# Patient Record
Sex: Male | Born: 1995 | Race: White | Hispanic: No | Marital: Single | Smoking: Never smoker
Health system: Southern US, Community
[De-identification: ages and names within clinical notes are randomized; demographics above are authoritative.]

## PROBLEM LIST (undated history)

## (undated) DIAGNOSIS — M545 Low back pain, unspecified: Secondary | ICD-10-CM

## (undated) DIAGNOSIS — K5190 Ulcerative colitis, unspecified, without complications: Secondary | ICD-10-CM

## (undated) DIAGNOSIS — R002 Palpitations: Secondary | ICD-10-CM

## (undated) DIAGNOSIS — K921 Melena: Secondary | ICD-10-CM

## (undated) DIAGNOSIS — R519 Headache, unspecified: Secondary | ICD-10-CM

## (undated) HISTORY — DX: Melena: K92.1

## (undated) HISTORY — DX: Headache, unspecified: R51.9

## (undated) HISTORY — DX: Palpitations: R00.2

## (undated) HISTORY — DX: Low back pain, unspecified: M54.50

## (undated) HISTORY — DX: Ulcerative colitis, unspecified, without complications: K51.90

---
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
## 2015-01-18 DIAGNOSIS — R002 Palpitations: Secondary | ICD-10-CM | POA: Insufficient documentation

## 2019-06-01 ENCOUNTER — Ambulatory Visit: Payer: Managed Care, Other (non HMO) | Attending: Internal Medicine

## 2019-06-01 DIAGNOSIS — Z20822 Contact with and (suspected) exposure to covid-19: Secondary | ICD-10-CM

## 2019-06-02 LAB — NOVEL CORONAVIRUS, NAA: SARS-CoV-2, NAA: NOT DETECTED

## 2020-04-25 DIAGNOSIS — R519 Headache, unspecified: Secondary | ICD-10-CM | POA: Insufficient documentation

## 2020-09-26 NOTE — Progress Notes (Signed)
[name] [address] [place] [address] Phone: [phone] Subjective:   Bruce Donath, am serving as a scribe for Dr. Antoine Primas. This visit occurred during the SARS-CoV-2 public health emergency.  Safety protocols were in place, including screening questions prior to the visit, additional usage of staff PPE, and extensive cleaning of exam room while observing appropriate contact time as indicated for disinfecting solutions.   I'm seeing this patient by the request  of:  Pcp, No  CC: Mid back pain   [facility]:[id number]  Joe Esparza is a 25 y.o. male coming in with complaint of back pain. Patient states that his pain is below R scapula for past 2 months. Pain can get sharp but is mostly dull. Has not tried anything for pain. 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  Patient does not remember any true injury.      Social History   Socioeconomic History  . Marital status: Married    Spouse name: Not on file  . Number of children: Not on file  . Years of education: Not on file  . Highest education level: Not on file  Occupational History  . Not on file  Tobacco Use  . Smoking status: Not on file  . Smokeless tobacco: Not on file  Substance and Sexual Activity  . Alcohol use: Not on file  . Drug use: Not on file  . Sexual activity: Not on file  Other Topics Concern  . Not on file  Social History Narrative  . Not on file   Social Determinants of Health   Financial Resource Strain: Not on file  Food Insecurity: Not on file  Transportation Needs: Not on file  Physical Activity: Not on file  Stress: Not on file  Social Connections: Not on file   Not on File No family history on file. No current outpatient medications on file.   Reviewed prior external information including notes and imaging  from  primary care provider As well as notes that were available from care everywhere and other healthcare systems.  Past medical history, social, surgical and family history all reviewed in electronic medical record.  No pertanent information unless stated regarding to the chief complaint.   Review of Systems:  No headache, visual changes, nausea, vomiting, diarrhea, constipation, dizziness, abdominal pain, skin rash, fevers, chills, night sweats, weight loss, swollen lymph nodes, body aches, joint swelling, chest pain, shortness of breath, mood changes. POSITIVE muscle aches only intermittently on the right side  Objective  Blood pressure 128/80, pulse 93, height 5\' 11"  (1.803 m), weight 165 lb (74.8 kg), SpO2 97 %.   General: No apparent distress alert and oriented x3 mood and affect normal, dressed appropriately.  HEENT: Pupils equal, extraocular movements intact  Respiratory: Patient's speak in full sentences and does not appear short of breath  Cardiovascular: No lower extremity edema, non tender, no erythema  Gait normal with good balance and coordination.  MSK: Right shoulder shows the patient does have some mild scapular dyskinesis noted.  Seems to be on the inferior aspect of the scapula.  Does have a bit of friction type rubbing felt in this area with abduction of the arm.  Good strength noted.  Neck exam very minimal tightness.  Negative Spurling's.  Osteopathic findings C4 F RS right  T4 extended  rotated and side bent right T7 extended rotated and side bent right with inhaled rib L3 flexed rotated and side bent right  [id number]; 15 additional minutes spent for Therapeutic exercises as stated in above notes.  This included exercises focusing on stretching, strengthening, with significant focus on eccentric aspects.   Long term goals include an improvement in range of motion, strength, endurance as well as avoiding reinjury. Patient's frequency would include in 1-2 times a day, 3-5  times a week for a duration of 6-12 weeks.  Exercises that included:  Basic scapular stabilization to include adduction and depression of scapula Scaption, focusing on proper movement and good control Internal and External rotation utilizing a theraband, with elbow tucked at side entire time Rows with theraband  Proper technique shown and discussed handout in great detail with ATC.  All questions were discussed and answered.      Impression and Recommendations:     The above documentation has been reviewed and is accurate and complete Judi Saa, DO

## 2020-09-27 ENCOUNTER — Other Ambulatory Visit: Payer: Self-pay

## 2020-09-27 ENCOUNTER — Encounter: Payer: Self-pay | Admitting: Family Medicine

## 2020-09-27 ENCOUNTER — Other Ambulatory Visit: Payer: Self-pay | Admitting: Family Medicine

## 2020-09-27 ENCOUNTER — Ambulatory Visit (INDEPENDENT_AMBULATORY_CARE_PROVIDER_SITE_OTHER): Payer: Managed Care, Other (non HMO)

## 2020-09-27 ENCOUNTER — Ambulatory Visit (INDEPENDENT_AMBULATORY_CARE_PROVIDER_SITE_OTHER): Payer: Managed Care, Other (non HMO) | Admitting: Family Medicine

## 2020-09-27 VITALS — BP 128/80 | HR 93 | Ht 71.0 in | Wt 165.0 lb

## 2020-09-27 DIAGNOSIS — M9902 Segmental and somatic dysfunction of thoracic region: Secondary | ICD-10-CM | POA: Diagnosis not present

## 2020-09-27 DIAGNOSIS — G2589 Other specified extrapyramidal and movement disorders: Secondary | ICD-10-CM

## 2020-09-27 DIAGNOSIS — M546 Pain in thoracic spine: Secondary | ICD-10-CM

## 2020-09-27 DIAGNOSIS — M9901 Segmental and somatic dysfunction of cervical region: Secondary | ICD-10-CM

## 2020-09-27 DIAGNOSIS — M9908 Segmental and somatic dysfunction of rib cage: Secondary | ICD-10-CM | POA: Diagnosis not present

## 2020-09-27 NOTE — Assessment & Plan Note (Signed)
Patient does have more of a scapular dyskinesis.  Seems to have more difficulties.  I did suggest the possibility of an x-ray at this time.  Patient does not do a lot of repetitive motion that I think contributes either.  Responded very well though to osteopathic manipulation.  Given some exercises that I think will be good for strengthening.  Follow-up with me again in 6 weeks otherwise.  Worsening pain will need to consider physical therapy

## 2020-09-27 NOTE — Patient Instructions (Addendum)
Xray today Keep hands in peripheral vision Exercises Ice after activity See me in 6 weeks

## 2020-09-27 NOTE — Assessment & Plan Note (Signed)
   Decision today to treat with OMT was based on Physical Exam  After verbal consent patient was treated with HVLA, ME, FPR techniques in  thoracic, rib, lumbar and reas, all areas are chronic   Patient tolerated the procedure well with improvement in symptoms  Patient given exercises, stretches and lifestyle modifications  See medications in patient instructions if given  Patient will follow up in 4-8 weeks

## 2020-11-07 NOTE — Progress Notes (Signed)
[name] [address] [place] [address] Phone: [phone] Subjective:   Joe Esparza, am serving as a scribe for Dr. Antoine Primas.  This visit occurred during the SARS-CoV-2 public health emergency.  Safety protocols were in place, including screening questions prior to the visit, additional usage of staff PPE, and extensive cleaning of exam room while observing appropriate contact time as indicated for disinfecting solutions.   I'm seeing this patient by the request  of:  Pcp, No  CC: Low back pain, upper back pain follow-up  CB[facility]:[id number]  Joe Esparza is a 25 y.o. male coming in with complaint of back and neck pain. OMT 09/27/2020. Patient states that he is doing better. Long at work or sleeping in odd position bothers him still. Pain on R inferior scapula. No pain with activity. Did stop doing back squats. Pain slightly after activity. Icing back.  Patient does have some limited range of motion he feels on the left side of his hip.  Medications patient has been prescribed: None  Taking:         Reviewed prior external information including notes and imaging from previsou exam, outside providers and external EMR if available.   As well as notes that were available from care everywhere and other healthcare systems.  Past medical history, social, surgical and family history all reviewed in electronic medical record.  No pertanent information unless stated regarding to the chief complaint.   No past medical history on file.  Not on File   Review of Systems:  No headache, visual changes, nausea, vomiting, diarrhea, constipation, dizziness, abdominal pain, skin rash, fevers, chills, night sweats, weight loss, swollen lymph nodes, body aches, joint swelling, chest pain, shortness of breath, mood changes. POSITIVE muscle aches  Objective  Blood pressure 110/84, pulse 73, height 5\' 11"  (1.803 m), weight 163 lb (73.9 kg), SpO2 98 %.    General: No apparent distress alert and oriented x3 mood and affect normal, dressed appropriately.  HEENT: Pupils equal, extraocular movements intact  Respiratory: Patient's speak in full sentences and does not appear short of breath  Cardiovascular: No lower extremity edema, non tender, no erythema  Patient does not seem to listen to the mid back on the right side again.  Patient does have some scoliosis noted.  Patient does have some mild limited external range of motion of the left hip compared to the right hip.  Some tenderness to palpation of the paraspinal musculature in the sacroiliac joint on the left side.  Negative straight leg test.  5-5 strength of the lower extremity  Osteopathic findings  T4 extended rotated and side bent right inhaled rib T9 extended rotated and side bent left L2 flexed rotated and side bent right Sacrum left on left       Assessment and Plan:  Scapular dyskinesis Scapular dyskinesis noted with patient still having more of the thoracic scoliosis.  Patient has responded fairly well to the osteopathic manipulation.  We will continue this.  Continue the home exercises.  Follow-up with me again in 6 to 8 weeks  Left low back pain Left-sided low back pain.  We will get x-rays to see if scoliosis is playing a role here.  Seems to be tightness secondary to the Flexeril and we will get x-rays of the hip to further evaluate for any type of bony impingement secondary to the decreased range of motion.  Patient did respond well to manipulation and follow-up again in 6  to 8 weeks   Nonallopathic problems  Decision today to treat with OMT was based on Physical Exam  After verbal consent patient was treated with HVLA, ME, FPR techniques in  thoracic, lumbar, and sacral  areas  Patient tolerated the procedure well with improvement in symptoms  Patient given exercises, stretches and lifestyle modifications  See medications in patient instructions if  given  Patient will follow up in 6-8 weeks      The above documentation has been reviewed and is accurate and complete Judi Saa, DO        Note: This dictation was prepared with Dragon dictation along with smaller phrase technology. Any transcriptional errors that result from this process are unintentional.

## 2020-11-08 ENCOUNTER — Encounter: Payer: Self-pay | Admitting: Family Medicine

## 2020-11-08 ENCOUNTER — Ambulatory Visit (INDEPENDENT_AMBULATORY_CARE_PROVIDER_SITE_OTHER): Payer: Managed Care, Other (non HMO) | Admitting: Family Medicine

## 2020-11-08 ENCOUNTER — Ambulatory Visit (INDEPENDENT_AMBULATORY_CARE_PROVIDER_SITE_OTHER): Payer: Managed Care, Other (non HMO)

## 2020-11-08 ENCOUNTER — Other Ambulatory Visit: Payer: Self-pay

## 2020-11-08 VITALS — BP 110/84 | HR 73 | Ht 71.0 in | Wt 163.0 lb

## 2020-11-08 DIAGNOSIS — M9903 Segmental and somatic dysfunction of lumbar region: Secondary | ICD-10-CM | POA: Diagnosis not present

## 2020-11-08 DIAGNOSIS — G8929 Other chronic pain: Secondary | ICD-10-CM

## 2020-11-08 DIAGNOSIS — M9904 Segmental and somatic dysfunction of sacral region: Secondary | ICD-10-CM

## 2020-11-08 DIAGNOSIS — M9902 Segmental and somatic dysfunction of thoracic region: Secondary | ICD-10-CM

## 2020-11-08 DIAGNOSIS — M545 Low back pain, unspecified: Secondary | ICD-10-CM | POA: Diagnosis not present

## 2020-11-08 DIAGNOSIS — G2589 Other specified extrapyramidal and movement disorders: Secondary | ICD-10-CM | POA: Diagnosis not present

## 2020-11-08 DIAGNOSIS — M25552 Pain in left hip: Secondary | ICD-10-CM

## 2020-11-08 NOTE — Patient Instructions (Addendum)
Good to see you  Hip flexor stretches Left hip and lumbar x-ray on way out Happy with results of upper back so far See me again in 6-7 weeks

## 2020-11-08 NOTE — Assessment & Plan Note (Signed)
Left-sided low back pain.  We will get x-rays to see if scoliosis is playing a role here.  Seems to be tightness secondary to the Flexeril and we will get x-rays of the hip to further evaluate for any type of bony impingement secondary to the decreased range of motion.  Patient did respond well to manipulation and follow-up again in 6 to 8 weeks

## 2020-11-08 NOTE — Assessment & Plan Note (Signed)
Scapular dyskinesis noted with patient still having more of the thoracic scoliosis.  Patient has responded fairly well to the osteopathic manipulation.  We will continue this.  Continue the home exercises.  Follow-up with me again in 6 to 8 weeks

## 2020-12-25 ENCOUNTER — Ambulatory Visit (INDEPENDENT_AMBULATORY_CARE_PROVIDER_SITE_OTHER): Payer: Managed Care, Other (non HMO) | Admitting: Family Medicine

## 2020-12-25 ENCOUNTER — Encounter: Payer: Self-pay | Admitting: Family Medicine

## 2020-12-25 ENCOUNTER — Other Ambulatory Visit: Payer: Self-pay

## 2020-12-25 VITALS — BP 118/84 | HR 96 | Ht 71.0 in | Wt 161.0 lb

## 2020-12-25 DIAGNOSIS — M9902 Segmental and somatic dysfunction of thoracic region: Secondary | ICD-10-CM

## 2020-12-25 DIAGNOSIS — G8929 Other chronic pain: Secondary | ICD-10-CM

## 2020-12-25 DIAGNOSIS — G2589 Other specified extrapyramidal and movement disorders: Secondary | ICD-10-CM | POA: Diagnosis not present

## 2020-12-25 DIAGNOSIS — M9901 Segmental and somatic dysfunction of cervical region: Secondary | ICD-10-CM | POA: Diagnosis not present

## 2020-12-25 DIAGNOSIS — M9903 Segmental and somatic dysfunction of lumbar region: Secondary | ICD-10-CM

## 2020-12-25 DIAGNOSIS — M545 Low back pain, unspecified: Secondary | ICD-10-CM

## 2020-12-25 DIAGNOSIS — M9904 Segmental and somatic dysfunction of sacral region: Secondary | ICD-10-CM

## 2020-12-25 DIAGNOSIS — M9908 Segmental and somatic dysfunction of rib cage: Secondary | ICD-10-CM | POA: Diagnosis not present

## 2020-12-25 NOTE — Progress Notes (Signed)
[name] [address] [place] [address] Phone: [phone] Subjective:   Bruce Donath, am serving as a scribe for Dr. Antoine Primas.  This visit occurred during the SARS-CoV-2 public health emergency.  Safety protocols were in place, including screening questions prior to the visit, additional usage of staff PPE, and extensive cleaning of exam room while observing appropriate contact time as indicated for disinfecting solutions.    CC: Back pain follow-up  YIR:[id number]  DESJUAN STEARNS is a 25 y.o. male coming in with complaint of back and neck pain. OMT 11/08/2020. Patient states that his hip has not been bothering him at rest. When muscle is stiff he will have grinding sensation over lateral aspect. Patient will have popping in that area but no pain relief with popping. Upper R trap does continue to be tight. L elbow pain has subsided.   Medications patient has been prescribed: None      Patient did have x-rays taken in November 09, 2020.  These were independently visualized by me Overall both are unremarkable    Reviewed prior external information including notes and imaging from previsou exam, outside providers and external EMR if available.   As well as notes that were available from care everywhere and other healthcare systems.  Past medical history, social, surgical and family history all reviewed in electronic medical record.  No pertanent information unless stated regarding to the chief complaint.      Review of Systems:  No headache, visual changes, nausea, vomiting, diarrhea, constipation, dizziness, abdominal pain, skin rash, fevers, chills, night sweats, weight loss, swollen lymph nodes, body aches, joint swelling, chest pain, shortness of breath, mood changes. POSITIVE muscle aches  Objective  Blood pressure 118/84, pulse 96, height 5\' 11"  (1.803 m), weight 161 lb (73 kg), SpO2 97 %.   General: No apparent distress alert  and oriented x3 mood and affect normal, dressed appropriately.  HEENT: Pupils equal, extraocular movements intact  Respiratory: Patient's speak in full sentences and does not appear short of breath  Cardiovascular: No lower extremity edema, non tender, no erythema   Osteopathic findings  C2 flexed rotated and side bent right C7 flexed rotated and side bent left T3 extended rotated and side bent right inhaled rib T8 extended rotated and side bent left L2 flexed rotated and side bent right Sacrum \\left  on left       Assessment and Plan: Left low back pain Tightness still noted on the left side.  Responding well to manipulation.  Patient is not taking any medications for it at the moment.  We will continue to monitor.  Worsening pain potential imaging is necessary.  Follow-up again in 6 to 8 weeks  Scapular dyskinesis Chronic, with exacerbation.  Still responding well to manipulation.  Discussed posture and ergonomics.  Increase activity slowly.  Follow-up again in 6 to 8 weeks.    Nonallopathic problems  Decision today to treat with OMT was based on Physical Exam  After verbal consent patient was treated with HVLA, ME, FPR techniques in cervical, rib, thoracic, lumbar, and sacral  areas  Patient tolerated the procedure well with improvement in symptoms  Patient given exercises, stretches and lifestyle modifications  See medications in patient instructions if given  Patient will follow up in 4-8 weeks      The above documentation has been reviewed and is accurate and complete , DO        Note: This dictation was prepared  with Dragon dictation along with smaller phrase technology. Any transcriptional errors that result from this process are unintentional.

## 2020-12-25 NOTE — Assessment & Plan Note (Signed)
Tightness still noted on the left side.  Responding well to manipulation.  Patient is not taking any medications for it at the moment.  We will continue to monitor.  Worsening pain potential imaging is necessary.  Follow-up again in 6 to 8 weeks

## 2020-12-25 NOTE — Assessment & Plan Note (Signed)
Chronic, with exacerbation.  Still responding well to manipulation.  Discussed posture and ergonomics.  Increase activity slowly.  Follow-up again in 6 to 8 weeks.

## 2020-12-25 NOTE — Patient Instructions (Signed)
See me in 6-8 weeks 

## 2021-02-05 NOTE — Progress Notes (Deleted)
  [name] [address] [place] [address] Phone: [phone] Subjective:    I'm seeing this patient by the request  of:  Pcp, No  CC: neck and back pain   [id number]  Joe Esparza is a 25 y.o. male coming in with complaint of back and neck pain. OMT on 12/25/2020. Patient states   Medications patient has been prescribed: None  Taking:         Reviewed prior external information including notes and imaging from previsou exam, outside providers and external EMR if available.   As well as notes that were available from care everywhere and other healthcare systems.  Past medical history, social, surgical and family history all reviewed in electronic medical record.  No pertanent information unless stated regarding to the chief complaint.   No past medical history on file.  Not on File   Review of Systems:  No headache, visual changes, nausea, vomiting, diarrhea, constipation, dizziness, abdominal pain, skin rash, fevers, chills, night sweats, weight loss, swollen lymph nodes, body aches, joint swelling, chest pain, shortness of breath, mood changes. POSITIVE muscle aches  Objective  There were no vitals taken for this visit.   General: No apparent distress alert and oriented x3 mood and affect normal, dressed appropriately.  HEENT: Pupils equal, extraocular movements intact  Respiratory: Patient's speak in full sentences and does not appear short of breath  Cardiovascular: No lower extremity edema, non tender, no erythema  Neck and back exam shows   Osteopathic findings  C2 flexed rotated and side bent right C7 flexed rotated and side bent left T3 extended rotated and side bent right inhaled rib T6 extended rotated and side bent left L2 flexed rotated and side bent right Sacrum right on right       Assessment and Plan:  No problem-specific Assessment & Plan notes found for this encounter.   Nonallopathic  problems  Decision today to treat with OMT was based on Physical Exam  After verbal consent patient was treated with HVLA, ME, FPR techniques in cervical, rib, thoracic, lumbar, and sacral  areas  Patient tolerated the procedure well with improvement in symptoms  Patient given exercises, stretches and lifestyle modifications  See medications in patient instructions if given  Patient will follow up in 4-8 weeks      The above documentation has been reviewed and is accurate and complete Judi Saa, DO       Note: This dictation was prepared with Dragon dictation along with smaller phrase technology. Any transcriptional errors that result from this process are unintentional.

## 2021-02-06 ENCOUNTER — Ambulatory Visit: Payer: Managed Care, Other (non HMO) | Admitting: Family Medicine

## 2021-06-24 ENCOUNTER — Telehealth: Payer: Managed Care, Other (non HMO) | Admitting: Physician Assistant

## 2021-06-24 DIAGNOSIS — K648 Other hemorrhoids: Secondary | ICD-10-CM | POA: Diagnosis not present

## 2021-06-24 MED ORDER — HYDROCORTISONE ACETATE 25 MG RE SUPP: 25.0000 mg | Freq: Two times a day (BID) | RECTAL | 0 refills | Status: DC

## 2021-06-24 NOTE — Patient Instructions (Signed)
Joe Esparza, thank you for joining Mar Daring, PA-C for today's virtual visit.  While this provider is not your primary care provider (PCP), if your PCP is located in our provider database this encounter information will be shared with them immediately following your visit.  Consent: (Patient) Joe Esparza provided verbal consent for this virtual visit at the beginning of the encounter.  Current Medications:  Current Outpatient Medications:    hydrocortisone (ANUSOL-HC) 25 MG suppository, Place 1 suppository (25 mg total) rectally 2 (two) times daily., Disp: 12 suppository, Rfl: 0   Medications ordered in this encounter:  Meds ordered this encounter  Medications   hydrocortisone (ANUSOL-HC) 25 MG suppository    Sig: Place 1 suppository (25 mg total) rectally 2 (two) times daily.    Dispense:  12 suppository    Refill:  0    Order Specific Question:   Supervising Provider    Answer:   Sabra Heck, BRIAN [[id number]]     *If you need refills on other medications prior to your next appointment, please contact your pharmacy*  Follow-Up: Call back or seek an in-person evaluation if the symptoms worsen or if the condition fails to improve as anticipated.  Other Instructions Hemorrhoids Hemorrhoids are swollen veins in and around the rectum or anus. There are two types of hemorrhoids: Internal hemorrhoids. These occur in the veins that are just inside the rectum. They may poke through to the outside and become irritated and painful. External hemorrhoids. These occur in the veins that are outside the anus and can be felt as a painful swelling or hard lump near the anus. Most hemorrhoids do not cause serious problems, and they can be managed with home treatments such as diet and lifestyle changes. If home treatments do not help the symptoms, procedures can be done to shrink or remove the hemorrhoids. What are the causes? This condition is caused by increased pressure in the anal area.  This pressure may result from various things, including: Constipation. Straining to have a bowel movement. Diarrhea. Pregnancy. Obesity. Sitting for long periods of time. Heavy lifting or other activity that causes you to strain. Anal sex. Riding a bike for a long period of time. What are the signs or symptoms? Symptoms of this condition include: Pain. Anal itching or irritation. Rectal bleeding. Leakage of stool (feces). Anal swelling. One or more lumps around the anus. How is this diagnosed? This condition can often be diagnosed through a visual exam. Other exams or tests may also be done, such as: An exam that involves feeling the rectal area with a gloved hand (digital rectal exam). An exam of the anal canal that is done using a small tube (anoscope). A blood test, if you have lost a significant amount of blood. A test to look inside the colon using a flexible tube with a camera on the end (sigmoidoscopy or colonoscopy). How is this treated? This condition can usually be treated at home. However, various procedures may be done if dietary changes, lifestyle changes, and other home treatments do not help your symptoms. These procedures can help make the hemorrhoids smaller or remove them completely. Some of these procedures involve surgery, and others do not. Common procedures include: Rubber band ligation. Rubber bands are placed at the base of the hemorrhoids to cut off their blood supply. Sclerotherapy. Medicine is injected into the hemorrhoids to shrink them. Infrared coagulation. A type of light energy is used to get rid of the hemorrhoids. Hemorrhoidectomy surgery. The hemorrhoids  are surgically removed, and the veins that supply them are tied off. Stapled hemorrhoidopexy surgery. The surgeon staples the base of the hemorrhoid to the rectal wall. Follow these instructions at home: Eating and drinking  Eat foods that have a lot of fiber in them, such as whole grains, beans,  nuts, fruits, and vegetables. Ask your health care provider about taking products that have added fiber (fiber supplements). Reduce the amount of fat in your diet. You can do this by eating low-fat dairy products, eating less red meat, and avoiding processed foods. Drink enough fluid to keep your urine pale yellow. Managing pain and swelling  Take warm sitz baths for 20 minutes, 3-4 times a day to ease pain and discomfort. You may do this in a bathtub or using a portable sitz bath that fits over the toilet. If directed, apply ice to the affected area. Using ice packs between sitz baths may be helpful. Put ice in a plastic bag. Place a towel between your skin and the bag. Leave the ice on for 20 minutes, 2-3 times a day. General instructions Take over-the-counter and prescription medicines only as told by your health care provider. Use medicated creams or suppositories as told. Get regular exercise. Ask your health care provider how much and what kind of exercise is best for you. In general, you should do moderate exercise for at least 30 minutes on most days of the week (150 minutes each week). This can include activities such as walking, biking, or yoga. Go to the bathroom when you have the urge to have a bowel movement. Do not wait. Avoid straining to have bowel movements. Keep the anal area dry and clean. Use wet toilet paper or moist towelettes after a bowel movement. Do not sit on the toilet for long periods of time. This increases blood pooling and pain. Keep all follow-up visits as told by your health care provider. This is important. Contact a health care provider if you have: Increasing pain and swelling that are not controlled by treatment or medicine. Difficulty having a bowel movement, or you are unable to have a bowel movement. Pain or inflammation outside the area of the hemorrhoids. Get help right away if you have: Uncontrolled bleeding from your rectum. Summary Hemorrhoids  are swollen veins in and around the rectum or anus. Most hemorrhoids can be managed with home treatments such as diet and lifestyle changes. Taking warm sitz baths can help ease pain and discomfort. In severe cases, procedures or surgery can be done to shrink or remove the hemorrhoids. This information is not intended to replace advice given to you by your health care provider. Make sure you discuss any questions you have with your health care provider. Document Revised: 11/21/2020 Document Reviewed: 11/21/2020 Elsevier Patient Education  [place].  Rectal Bleeding Rectal bleeding is when blood passes out of the opening between the buttocks (anus). People with rectal bleeding may notice bright red blood in their underwear or in the toilet after having a bowel movement. They may also have blood mixed with their stool (feces), or dark red or black stools. Rectal bleeding is usually a sign that something is wrong. Many things can cause rectal bleeding, including: Diverticulosis. This is a condition in which pockets or sacs project from the bowel. Hemorrhoids. These are blood vessels around the anus or inside the rectum that are larger than normal. Anal fissures. This is a tear in the anus. Proctitis and colitis. These are conditions in which the  rectum, colon, or anus become inflamed. Polyps. These are growths that can be cancerous (malignant) or noncancerous (benign). Infections of the intestines. Fistulas. These are abnormal openings in the rectum and anus. Rectal prolapse. This is when a part of the rectum sticks out from the anus. Follow these instructions at home: Pay attention to any changes in your symptoms. Take these actions to help reduce bleeding and discomfort: Medicines Take over-the-counter and prescription medicines only as told by your health care provider. Ask your health care provider about changing or stopping your regular medicines or supplements. This is especially  important if you are taking blood thinners. Medicines that thin the blood can make rectal bleeding worse. Managing constipation Your condition may cause constipation. To prevent or treat constipation, or to help make your stools soft, you may need to: Drink enough fluid to keep your urine pale yellow. Take over-the-counter or prescription medicines. Eat foods that are high in fiber, such as beans, whole grains, and fresh fruits and vegetables. Ask your health care provider if you need a supplement to give you more fiber. Limit foods that are high in fat and processed sugars, such as fried or sweet foods.  General instructions Try not to strain when having a bowel movement. Try taking a warm bath. This may help to soothe any pain in your rectum. Keep all follow-up visits as told by your health care provider. This is important. Contact a health care provider if you: Have pain or tenderness in your abdomen. Have a fever. Have weakness. Have nausea. Cannot have a bowel movement. Get help right away if you have: New or increased rectal bleeding. Black or dark red stools. Vomit with blood or something that looks like coffee grounds. A fainting episode. Severe pain in your rectum. Summary Rectal bleeding is usually a sign that something is wrong. This condition should be evaluated by a health care provider. Eat a diet that is high in fiber. This will help keep your stools soft, making it easier to pass stools without straining. Medicines that thin the blood can make rectal bleeding worse. Get help right away if you have new or increased rectal bleeding, black or dark red stools, blood in your vomit, an episode of fainting, or severe pain in your rectum. This information is not intended to replace advice given to you by your health care provider. Make sure you discuss any questions you have with your health care provider. Document Revised: 04/13/2019 Document Reviewed: 04/13/2019 Elsevier  Patient Education  2022 [employer].    If you have been instructed to have an in-person evaluation today at a local Urgent Care facility, please use the link below. It will take you to a list of all of our available [facility], including address, phone number and hours of operation. Please do not delay care.  [facility]  If you or a family member do not have a primary care provider, use the link below to schedule a visit and establish care. When you choose a [facility] primary care physician or advanced practice provider, you gain a long-term partner in health. Find a Primary Care Provider  Learn more about 's in-office and virtual care options: [facility]

## 2021-06-24 NOTE — Progress Notes (Signed)
Virtual Visit Consent   [name], you are scheduled for a virtual visit with a [facility] provider today.     Just as with appointments in the office, your consent must be obtained to participate.  Your consent will be active for this visit and any virtual visit you may have with one of our providers in the next 365 days.     If you have a MyChart account, a copy of this consent can be sent to you electronically.  All virtual visits are billed to your insurance company just like a traditional visit in the office.    As this is a virtual visit, video technology does not allow for your provider to perform a traditional examination.  This may limit your provider's ability to fully assess your condition.  If your provider identifies any concerns that need to be evaluated in person or the need to arrange testing (such as labs, EKG, etc.), we will make arrangements to do so.     Although advances in technology are sophisticated, we cannot ensure that it will always work on either your end or our end.  If the connection with a video visit is poor, the visit may have to be switched to a telephone visit.  With either a video or telephone visit, we are not always able to ensure that we have a secure connection.     I need to obtain your verbal consent now.   Are you willing to proceed with your visit today?    Joe Esparza has provided verbal consent on 06/24/2021 for a virtual visit (video or telephone).   Mar Daring, PA-C   Date: 06/24/2021 10:39 AM   Virtual Visit via Video Note   I, Mar Daring, connected with  Joe Esparza  ([id number], [birth year]) on 06/24/21 at 10:00 AM EST by a video-enabled telemedicine application and verified that I am speaking with the correct person using two identifiers.  Location: Patient: Virtual Visit Location Patient: Mobile Provider: Virtual Visit Location Provider: Home Office   I discussed the limitations of evaluation and  management by telemedicine and the availability of in person appointments. The patient expressed understanding and agreed to proceed.    Interactive audio and video communications were attempted, although failed due to patient's inability to connect to video. Continued visit with audio only interaction with patient agreement.  History of Present Illness: Joe Esparza is a 26 y.o. who identifies as a male who was assigned male at birth, and is being seen today for rectal bleeding. Reports having bleeding with BM. Not on stool or in stool. Notes some in the toilet (mild) and on TP with wiping. Blood is described as bright red and painless. Has been having more diarrhea over last 3-4 days. Denies constipation, lifting heavy objects, straining, abdominal pain, abdominal bloating, nausea, vomiting, fevers, chills, or body aches.    Problems:  Patient Active Problem List   Diagnosis Date Noted   Left low back pain 11/08/2020   Scapular dyskinesis 09/27/2020   Somatic dysfunction of spine, thoracic 09/27/2020   Frequent headaches 04/25/2020   Palpitations 01/18/2015    Allergies: Not on File Medications:  Current Outpatient Medications:    hydrocortisone (ANUSOL-HC) 25 MG suppository, Place 1 suppository (25 mg total) rectally 2 (two) times daily., Disp: 12 suppository, Rfl: 0  Observations/Objective: Patient is well-developed, well-nourished in no acute distress.  No labored breathing.  Speech is clear and coherent with logical content.  Patient  is alert and oriented at baseline.    Assessment and Plan: 1. Internal hemorrhoid - hydrocortisone (ANUSOL-HC) 25 MG suppository; Place 1 suppository (25 mg total) rectally 2 (two) times daily.  Dispense: 12 suppository; Refill: 0  - DDx: Hemorrhoids, infectious diarrhea, IBD (Crohn's, UC), enteritis - Suspect more internal hemorrhoid - Will try Anusol HC suppositories - Push fluids - Increase dietary fiber - Seek in person evaluation if  symptoms worsen or fail to improve following treatment  Follow Up Instructions: I discussed the assessment and treatment plan with the patient. The patient was provided an opportunity to ask questions and all were answered. The patient agreed with the plan and demonstrated an understanding of the instructions.  A copy of instructions were sent to the patient via MyChart unless otherwise noted below.    The patient was advised to call back or seek an in-person evaluation if the symptoms worsen or if the condition fails to improve as anticipated.  Time:  I spent 25 minutes with the patient via telehealth technology discussing the above problems/concerns.    Mar Daring, PA-C

## 2021-07-09 NOTE — Progress Notes (Signed)
07/10/2021 JAMY SCHNITZER [id number] [birth year]   ASSESSMENT AND PLAN:   Rectal bleeding Family history of UC, declines rectal exam today Has had 2 episodes, no tenesmus or overt signs of IBD but need to rule out Will get inflammatory labs and schedule for flex sigmoidoscopy to evaluate for hemorrhoids/colitis.  We have discussed the risks of bleeding, infection, perforation, medication reactions, with flex sig. All questions were answered and the patient acknowledges these risk and wishes to proceed. -     Sedimentation rate; Future -     C-reactive protein; Future -     Calprotectin, Fecal; Future  Diarrhea, unspecified type -     Comprehensive metabolic panel; Future -     CBC with Differential/Platelet; Future -     Sedimentation rate; Future -     C-reactive protein; Future -     TSH; Future -     IgA; Future -     Tissue Transglutaminase Abs,IgG,IgA; Future -     GI Profile, Stool, PCR; Future -     Calprotectin, Fecal; Future   Future Appointments  Date Time Provider [place]  08/14/2021  1:30 PM McGowen, Adrian Blackwater, MD [facility]    Patient Care Team: Pcp, No as PCP - General  HISTORY OF PRESENT ILLNESS: 26 y.o. male referred by Fenton Malling, NP at UC, no PCP, with a past medical history of palpitations, headaches, muscular skeletal complaints and others listed below presents for evaluation of Rectal bleeding.  Patient was seen via video visit with Fenton Malling, NP urgent care.  Given anusol HC suppositories and referred here.  End of Jan started to have bright red painless bleeding on toilet paper and some in toilet.  He had diarrhea 7-8 days, 3-4 x a day ,at the same time of the rectal bleeding, had leftover ABX cipro took 3 days and has had improvement of diarrhea. Still has BRB on TP this AM, less, stools are more formed.  Denies fever, chills, AB pain, N/V. Denies rectal pain had some soreness with going to restroom so much but denies  burning, itching.  Has had increase in AB noise.  Also had similar episode 4-6 months ago, lasted 1 week and got back, no as severe with the bleeding.  Dad is patient here, sees Dr. Henrene Pastor and has history of UC, no sick contacts.  Brother patient here, IBS.  No family history GI malignancy.  The patient does not have weight loss.   The patient has not had any extraintestinal symptoms   Current Medications:  No current outpatient medications on file.  Medical History: No past medical history on file. Allergies: No Known Allergies   Surgical History:  He  has no past surgical history on file. Family History:  His family history is not on file. Social History:   reports that he has never smoked. He has never been exposed to tobacco smoke. He has never used smokeless tobacco. He reports that he does not currently use alcohol. No history on file for drug use.  REVIEW OF SYSTEMS  : All other systems reviewed and negative except where noted in the History of Present Illness.   PHYSICAL EXAM: BP 116/78    Pulse 80    Ht 5\' 11"  (1.803 m)    Wt 163 lb (73.9 kg)    BMI 22.73 kg/m  General:   Pleasant, well developed male in no acute distress Head:  Normocephalic and atraumatic. Eyes: sclerae anicteric,conjunctive pink  Heart:  regular rate and rhythm Pulm: Clear anteriorly; no wheezing Abdomen:  Soft, Flat AB, skin exam normal, Normal bowel sounds. mild tenderness in the lower abdomen. Without guarding and Without rebound, without hepatomegaly. Rectal exam: Declines Extremities:  Without edema. Msk:  Symmetrical without gross deformities. Peripheral pulses intact.  Neurologic:  Alert and  oriented x4;  grossly normal neurologically. Skin:   Dry and intact without significant lesions or rashes. Psychiatric: Demonstrates good judgement and reason without abnormal affect or behaviors.   Vladimir Crofts, PA-C 3:03 PM

## 2021-07-10 ENCOUNTER — Other Ambulatory Visit (INDEPENDENT_AMBULATORY_CARE_PROVIDER_SITE_OTHER): Payer: Managed Care, Other (non HMO)

## 2021-07-10 ENCOUNTER — Encounter: Payer: Self-pay | Admitting: Physician Assistant

## 2021-07-10 ENCOUNTER — Ambulatory Visit (INDEPENDENT_AMBULATORY_CARE_PROVIDER_SITE_OTHER): Payer: Managed Care, Other (non HMO) | Admitting: Physician Assistant

## 2021-07-10 VITALS — BP 116/78 | HR 80 | Ht 71.0 in | Wt 163.0 lb

## 2021-07-10 DIAGNOSIS — K625 Hemorrhage of anus and rectum: Secondary | ICD-10-CM

## 2021-07-10 DIAGNOSIS — R197 Diarrhea, unspecified: Secondary | ICD-10-CM

## 2021-07-10 LAB — CBC WITH DIFFERENTIAL/PLATELET
Basophils Absolute: 0 10*3/uL (ref 0.0–0.1)
Basophils Relative: 0.4 % (ref 0.0–3.0)
Eosinophils Absolute: 0.7 10*3/uL (ref 0.0–0.7)
Eosinophils Relative: 6.9 % — ABNORMAL HIGH (ref 0.0–5.0)
HCT: 42.7 % (ref 39.0–52.0)
Hemoglobin: 14.7 g/dL (ref 13.0–17.0)
Lymphocytes Relative: 22.6 % (ref 12.0–46.0)
Lymphs Abs: 2.4 10*3/uL (ref 0.7–4.0)
MCHC: 34.5 g/dL (ref 30.0–36.0)
MCV: 85.7 fl (ref 78.0–100.0)
Monocytes Absolute: 0.9 10*3/uL (ref 0.1–1.0)
Monocytes Relative: 8.6 % (ref 3.0–12.0)
Neutro Abs: 6.6 10*3/uL (ref 1.4–7.7)
Neutrophils Relative %: 61.5 % (ref 43.0–77.0)
Platelets: 272 10*3/uL (ref 150.0–400.0)
RBC: 4.98 Mil/uL (ref 4.22–5.81)
RDW: 12.6 % (ref 11.5–15.5)
WBC: 10.7 10*3/uL — ABNORMAL HIGH (ref 4.0–10.5)

## 2021-07-10 LAB — TSH: TSH: 2.31 u[IU]/mL (ref 0.35–5.50)

## 2021-07-10 LAB — SEDIMENTATION RATE: Sed Rate: 5 mm/hr (ref 0–15)

## 2021-07-10 NOTE — Progress Notes (Signed)
Reviewed. Robley Fries go ahead and plan on complete colonoscopy.  If he has evidence for colitis then we would want to know the complete extent.  Furthermore, with the family history of IBD, ileal intubation might be useful. Thanks, Dr. Henrene Pastor

## 2021-07-10 NOTE — Patient Instructions (Addendum)
If you are age 26 or younger, your body mass index should be between 19-25. Your Body mass index is 22.73 kg/m. If this is out of the aformentioned range listed, please consider follow up with your Primary Care Provider.  ________________________________________________________  The [facility] providers would like to encourage you to use [facility] to communicate with providers for non-urgent requests or questions.  Due to long hold times on the telephone, sending your provider a message by [facility] may be a faster and more efficient way to get a response.  Please allow 48 business hours for a response.  Please remember that this is for non-urgent requests.  _______________________________________________________  Joe Esparza have been scheduled for a flexible sigmoidoscopy. Please follow the written instructions given to you at your visit today. If you use inhalers (even only as needed), please bring them with you on the day of your procedure. *Call the office if you are not able to find any Magnesium Citrate. We will send you a different set of instructions.  Your provider has requested that you go to the basement level for lab work before leaving today. Press "B" on the elevator. The lab is located at the first door on the left as you exit the elevator.  Follow up pending the results of your labs and Flex Sig.  Thank you for entrusting me with your care and choosing [facility].  Vicie Mutters, PA-C

## 2021-07-11 ENCOUNTER — Other Ambulatory Visit: Payer: Self-pay

## 2021-07-11 ENCOUNTER — Other Ambulatory Visit: Payer: Managed Care, Other (non HMO)

## 2021-07-11 ENCOUNTER — Telehealth: Payer: Self-pay | Admitting: Physician Assistant

## 2021-07-11 DIAGNOSIS — R197 Diarrhea, unspecified: Secondary | ICD-10-CM

## 2021-07-11 DIAGNOSIS — K625 Hemorrhage of anus and rectum: Secondary | ICD-10-CM

## 2021-07-11 LAB — TISSUE TRANSGLUTAMINASE ABS,IGG,IGA
(tTG) Ab, IgA: <1 U/mL
(tTG) Ab, IgG: <1 U/mL

## 2021-07-11 LAB — COMPREHENSIVE METABOLIC PANEL
ALT: 12 U/L (ref 0–53)
AST: 17 U/L (ref 0–37)
Albumin: 4.8 g/dL (ref 3.5–5.2)
Alkaline Phosphatase: 95 U/L (ref 39–117)
BUN: 15 mg/dL (ref 6–23)
CO2: 28 mEq/L (ref 19–32)
Calcium: 9.8 mg/dL (ref 8.4–10.5)
Chloride: 103 mEq/L (ref 96–112)
Creatinine, Ser: 0.99 mg/dL (ref 0.40–1.50)
GFR: 105.97 mL/min (ref >60.00)
Glucose, Bld: 89 mg/dL (ref 70–99)
Potassium: 4 mEq/L (ref 3.5–5.1)
Sodium: 140 mEq/L (ref 135–145)
Total Bilirubin: 0.4 mg/dL (ref 0.2–1.2)
Total Protein: 8.2 g/dL (ref 6.0–8.3)

## 2021-07-11 LAB — IGA: Immunoglobulin A: 121 mg/dL (ref 47–310)

## 2021-07-11 LAB — C-REACTIVE PROTEIN: CRP: <1 mg/dL (ref 0.5–20.0)

## 2021-07-11 MED ORDER — PLENVU 140 G PO SOLR: 1.0000 | ORAL | 0 refills | Status: DC

## 2021-07-11 NOTE — Progress Notes (Signed)
Pt returned call. Informed about the need to change procedure type and about new prep instructions sent via My Chart. Verbalized acceptance and understanding.

## 2021-07-11 NOTE — Progress Notes (Addendum)
Called pt to inform about Dr. Lamar Sprinkles orders to change FS to complete colonoscopy. LVM requesting returned call. At Dr. Lamar Sprinkles and [facility] request, procedure TYPE has been changed to reflect Dr. Lamar Sprinkles request to perform complete colonoscopy. Amb referral updated to reflect this change. Also sent UPDATED prep instructions to pt via My Chart. Plenvu Rx sent to pt preferred pharmacy. Will continue efforts to contact pt to make him aware of this change.

## 2021-07-11 NOTE — Telephone Encounter (Signed)
Addressed in a previously created encounter. Closing as duplicate 

## 2021-07-11 NOTE — Telephone Encounter (Signed)
Patient returning your call, please advise. 

## 2021-07-11 NOTE — Addendum Note (Signed)
Addended by: Merlene Pulling on: 07/11/2021 08:34 AM   Modules accepted: Orders

## 2021-07-15 LAB — GI PROFILE, STOOL, PCR

## 2021-07-15 LAB — CALPROTECTIN, FECAL: Calprotectin, Fecal: 706 ug/g — ABNORMAL HIGH (ref 0–120)

## 2021-07-19 ENCOUNTER — Encounter: Payer: Self-pay | Admitting: Internal Medicine

## 2021-07-24 ENCOUNTER — Ambulatory Visit (AMBULATORY_SURGERY_CENTER): Payer: Managed Care, Other (non HMO) | Admitting: Internal Medicine

## 2021-07-24 ENCOUNTER — Encounter: Payer: Self-pay | Admitting: Internal Medicine

## 2021-07-24 ENCOUNTER — Other Ambulatory Visit: Payer: Managed Care, Other (non HMO) | Admitting: Internal Medicine

## 2021-07-24 ENCOUNTER — Other Ambulatory Visit: Payer: Self-pay

## 2021-07-24 VITALS — BP 106/64 | HR 87 | Temp 98.0°F | Resp 11 | Ht 71.0 in | Wt 163.0 lb

## 2021-07-24 DIAGNOSIS — K5180 Other ulcerative colitis without complications: Secondary | ICD-10-CM

## 2021-07-24 DIAGNOSIS — K625 Hemorrhage of anus and rectum: Secondary | ICD-10-CM

## 2021-07-24 DIAGNOSIS — R197 Diarrhea, unspecified: Secondary | ICD-10-CM

## 2021-07-24 DIAGNOSIS — K51811 Other ulcerative colitis with rectal bleeding: Secondary | ICD-10-CM

## 2021-07-24 DIAGNOSIS — K529 Noninfective gastroenteritis and colitis, unspecified: Secondary | ICD-10-CM | POA: Diagnosis not present

## 2021-07-24 HISTORY — PX: COLONOSCOPY: SHX174

## 2021-07-24 MED ORDER — SODIUM CHLORIDE 0.9 % IV SOLN: 500.0000 mL | Freq: Once | INTRAVENOUS | Status: DC

## 2021-07-24 MED ORDER — MESALAMINE 4 G RE ENEM: 4.0000 g | ENEMA | Freq: Every evening | RECTAL | 11 refills | Status: AC

## 2021-07-24 MED ORDER — MESALAMINE 1.2 G PO TBEC: 4.8000 g | DELAYED_RELEASE_TABLET | Freq: Every day | ORAL | 11 refills | Status: AC

## 2021-07-24 MED ORDER — MESALAMINE 1.2 G PO TBEC: 4.8000 g | DELAYED_RELEASE_TABLET | Freq: Every day | ORAL | 11 refills | Status: DC

## 2021-07-24 NOTE — Progress Notes (Signed)
VS by CW. ?

## 2021-07-24 NOTE — Op Note (Signed)
[facility] ?Patient Name: Joe Esparza ?Procedure Date: 07/24/2021 1:15 PM ?MRN: [id number] ?Endoscopist: Wilhemina Bonito. Marina Goodell , MD ?Age: 26 ?Referring MD:  ?Date of Birth: 1995/10/21 ?Gender: Male ?Account #: [id number] ?Procedure:                Colonoscopy with biopsies ?Indications:              Rectal bleeding, Change in bowel habits ?Medicines:                Monitored Anesthesia Care ?Procedure:                Pre-Anesthesia Assessment: ?                          - Prior to the procedure, a History and Physical  ?                          was performed, and patient medications and  ?                          allergies were reviewed. The patient's tolerance of  ?                          previous anesthesia was also reviewed. The risks  ?                          and benefits of the procedure and the sedation  ?                          options and risks were discussed with the patient.  ?                          All questions were answered, and informed consent  ?                          was obtained. Prior Anticoagulants: The patient has  ?                          taken no previous anticoagulant or antiplatelet  ?                          agents. ASA Grade Assessment: I - A normal, healthy  ?                          patient. After reviewing the risks and benefits,  ?                          the patient was deemed in satisfactory condition to  ?                          undergo the procedure. ?                          After obtaining informed consent, the colonoscope  ?  was passed under direct vision. Throughout the  ?                          procedure, the patient's blood pressure, pulse, and  ?                          oxygen saturations were monitored continuously. The  ?                          Olympus CF-[id number]) Colonoscope was  ?                          introduced through the anus and advanced to the the  ?                          cecum, identified by  appendiceal orifice and  ?                          ileocecal valve. The terminal ileum, ileocecal  ?                          valve, appendiceal orifice, and rectum were  ?                          photographed. The quality of the bowel preparation  ?                          was excellent. The colonoscopy was performed  ?                          without difficulty. The patient tolerated the  ?                          procedure well. The bowel preparation used was  ?                          SUPREP via split dose instruction. ?Scope In: 1:42:53 PM ?Scope Out: 1:58:35 PM ?Scope Withdrawal Time: 0 hours 12 minutes 26 seconds  ?Total Procedure Duration: 0 hours 15 minutes 42 seconds  ?Findings:                 The terminal ileum appeared normal. ?                          Inflammation, consistent with ulcerative colitis  ?                          was found in a patchy distribution in the cecum and  ?                          proximal right colon. Biopsies were taken. The  ?                          distal right colon, transverse colon, and  ?  descending colon appeared grossly normal. Biopsies  ?                          were taken. There was moderately severe contiguous  ?                          colitis starting at the anal verge and extending to  ?                          the rectosigmoid region (30 cm). This was  ?                          manifested by edema, erythema, friability, and  ?                          superficial ulceration. See images. Biopsies taken ?                          The biopsies were taken with a cold forceps for  ?                          histology. ?Complications:            No immediate complications. Estimated blood loss:  ?                          None. ?Estimated Blood Loss:     Estimated blood loss: none. ?Impression:               1. Moderately severe ulcerative colitis involving  ?                          the rectum and rectosigmoid region (to 30 cm)  in a  ?                          contiguous fashion as well as the cecum and  ?                          proximal ascending colon in a patchy distribution.  ?                          Biopsied ?                          2. Normal intervening distal right colon,  ?                          transverse colon, and descending colon. Biopsied ?                          3. Normal terminal ileum ?Recommendation:           - Repeat colonoscopy (date not yet determined) for  ?                          surveillance. ?                          -  Patient has a contact number available for  ?                          emergencies. The signs and symptoms of potential  ?                          delayed complications were discussed with the  ?                          patient. Return to normal activities tomorrow.  ?                          Written discharge instructions were provided to the  ?                          patient. ?                          - Resume previous diet. ?                          - Continue present medications. ?                          - Await pathology results. ?                          -PRESCRIBE Lialda 4.8 g daily (1.2 g; #120); 11  ?                          refills. Take 4 tablets (1.2 g each) (4.8 g total)  ?                          by mouth each morning ?                          -PRESCRIBE Rowasa enema; #30; 1 per rectum at  ?                          night; 11 refills ?                          -Office follow-up with Dr. Marina Goodell in 2 to 3 weeks ?Wilhemina Bonito. Marina Goodell, MD ?07/24/2021 2:16:47 PM ?This report has been signed electronically. ?

## 2021-07-24 NOTE — Progress Notes (Signed)
Sedate, gd SR, tolerated procedure well, VSS, report to RN 

## 2021-07-24 NOTE — Patient Instructions (Addendum)
Thank you for allowing [place] to care for you today. ?Await final pathology, approximately 1 week.  We will be in touch with results. ?Office follow up with Dr Marina Goodell in 2-3 weeks.  We will call to set up this appointment. ?New medications to start, ?Lialda 4.8 grams taken every morning ( this was sent to your mail order phamacy) ?Rowasa enema taken per rectum every evening before bed ( to pick up at your [facility]) ?Resume normal daily activities tomorrow. ? ? ? ?YOU HAD AN ENDOSCOPIC PROCEDURE TODAY AT THE [facility]:   Refer to the procedure report that was given to you for any specific questions about what was found during the examination.  If the procedure report does not answer your questions, please call your gastroenterologist to clarify.  If you requested that your care partner not be given the details of your procedure findings, then the procedure report has been included in a sealed envelope for you to review at your convenience later. ? ?YOU SHOULD EXPECT: Some feelings of bloating in the abdomen. Passage of more gas than usual.  Walking can help get rid of the air that was put into your GI tract during the procedure and reduce the bloating. If you had a lower endoscopy (such as a colonoscopy or flexible sigmoidoscopy) you may notice spotting of blood in your stool or on the toilet paper. If you underwent a bowel prep for your procedure, you may not have a normal bowel movement for a few days. ? ?Please Note:  You might notice some irritation and congestion in your nose or some drainage.  This is from the oxygen used during your procedure.  There is no need for concern and it should clear up in a day or so. ? ?SYMPTOMS TO REPORT IMMEDIATELY: ? ?Following lower endoscopy (colonoscopy or flexible sigmoidoscopy): ? Excessive amounts of blood in the stool ? Significant tenderness or worsening of abdominal pains ? Swelling of the abdomen that is new, acute ? Fever of 100?F or highe ? ? ?For urgent or  emergent issues, a gastroenterologist can be reached at any hour by calling [phone]. ?Do not use MyChart messaging for urgent concerns.  ? ? ?DIET:  We do recommend a small meal at first, but then you may proceed to your regular diet.  Drink plenty of fluids but you should avoid alcoholic beverages for 24 hours. ? ?ACTIVITY:  You should plan to take it easy for the rest of today and you should NOT DRIVE or use heavy machinery until tomorrow (because of the sedation medicines used during the test).   ? ?FOLLOW UP: ?Our staff will call the number listed on your records 48-72 hours following your procedure to check on you and address any questions or concerns that you may have regarding the information given to you following your procedure. If we do not reach you, we will leave a message.  We will attempt to reach you two times.  During this call, we will ask if you have developed any symptoms of COVID 19. If you develop any symptoms (ie: fever, flu-like symptoms, shortness of breath, cough etc.) before then, please call [phone].  If you test positive for Covid 19 in the 2 weeks post procedure, please call and report this information to [place].   ? ?If any biopsies were taken you will be contacted by phone or by letter within the next 1-3 weeks.  Please call us at [phone] if you have not heard  about the biopsies in 3 weeks.  ? ? ?SIGNATURES/CONFIDENTIALITY: ?You and/or your care partner have signed paperwork which will be entered into your electronic medical record.  These signatures attest to the fact that that the information above on your After Visit Summary has been reviewed and is understood.  Full responsibility of the confidentiality of this discharge information lies with you and/or your care-partner.  ?

## 2021-07-24 NOTE — Progress Notes (Signed)
Called to room to assist during endoscopic procedure.  Patient ID and intended procedure confirmed with present staff. Received instructions for my participation in the procedure from the performing physician.  

## 2021-07-24 NOTE — Progress Notes (Signed)
ASSESSMENT AND PLAN:  ?  ?Rectal bleeding ?Family history of UC, declines rectal exam today ?Has had 2 episodes, no tenesmus or overt signs of IBD but need to rule out ?Will get inflammatory labs and schedule for flex sigmoidoscopy to evaluate for hemorrhoids/colitis.  ?We have discussed the risks of bleeding, infection, perforation, medication reactions, with flex sig. All questions were answered and the patient acknowledges these risk and wishes to proceed. ?-     Sedimentation rate; Future ?-     C-reactive protein; Future ?-     Calprotectin, Fecal; Future ?  ?Diarrhea, unspecified type ?-     Comprehensive metabolic panel; Future ?-     CBC with Differential/Platelet; Future ?-     Sedimentation rate; Future ?-     C-reactive protein; Future ?-     TSH; Future ?-     IgA; Future ?-     Tissue Transglutaminase Abs,IgG,IgA; Future ?-     GI Profile, Stool, PCR; Future ?-     Calprotectin, Fecal; Future ?  ?  ?      ?Future Appointments  ?Date Time Provider Department Center  ?08/14/2021  1:30 PM McGowen, Maryjean Morn, MD [facility]  ?  ?  ?Patient Care Team: ?Pcp, No as PCP - General ?  ?HISTORY OF PRESENT ILLNESS: ?26 y.o. male referred by Joycelyn Man, NP at UC, no PCP, with a past medical history of palpitations, headaches, muscular skeletal complaints and others listed below presents for evaluation of Rectal bleeding. ?  ?Patient was seen via video visit with Joycelyn Man, NP urgent care.  ?Given anusol HC suppositories and referred here. ?  ?End of Jan started to have bright red painless bleeding on toilet paper and some in toilet.  ?He had diarrhea 7-8 days, 3-4 x a day ,at the same time of the rectal bleeding, had leftover ABX cipro took 3 days and has had improvement of diarrhea. ?Still has BRB on TP this AM, less, stools are more formed.  ?Denies fever, chills, AB pain, N/V. Denies rectal pain had some soreness with going to restroom so much but denies burning, itching.  ?Has had increase in AB  noise.  ?Also had similar episode 4-6 months ago, lasted 1 week and got back, no as severe with the bleeding.  ?Dad is patient here, sees Dr. Marina Goodell and has history of UC, no sick contacts.  ?Brother patient here, IBS.  ?No family history GI malignancy.  ?The patient does not have weight loss.   ?The patient has not had any extraintestinal symptoms ?  ?  ?Current Medications:  ?No current outpatient medications on file. ?  ?Medical History: No past medical history on file. ?Allergies: No Known Allergies  ?  ?Surgical History:  ?He  has no past surgical history on file. ?Family History:  ?His family history is not on file. ?Social History:  ? reports that he has never smoked. He has never been exposed to tobacco smoke. He has never used smokeless tobacco. He reports that he does not currently use alcohol. No history on file for drug use. ?  ?REVIEW OF SYSTEMS  : All other systems reviewed and negative except where noted in the History of Present Illness. ?  ?  ?PHYSICAL EXAM: ?BP 116/78   Pulse 80   Ht 5\' 11"  (1.803 m)   Wt 163 lb (73.9 kg)   BMI 22.73 kg/m?  ?General:   Pleasant, well developed male in no acute distress ?Head:  Normocephalic  and atraumatic. ?Eyes: sclerae anicteric,conjunctive pink  ?Heart:  regular rate and rhythm ?Pulm: Clear anteriorly; no wheezing ?Abdomen:  Soft, Flat AB, skin exam normal, Normal bowel sounds. mild tenderness in the lower abdomen. Without guarding and Without rebound, without hepatomegaly. ?Rectal exam: Declines ?Extremities:  Without edema. ?Msk:  Symmetrical without gross deformities. Peripheral pulses intact.  ?Neurologic:  Alert and  oriented x4;  grossly normal neurologically. ?Skin:   Dry and intact without significant lesions or rashes. ?Psychiatric: Demonstrates good judgement and reason without abnormal affect or behaviors. ?  ?  ?Doree Albee, PA-C ?3:03 PM ? ?Patient was seen in the GI office by the GI physician assistant 2 weeks ago.  See the above complete  H&P.  No interval changes.  Now for colonoscopy ?

## 2021-07-26 ENCOUNTER — Telehealth: Payer: Self-pay

## 2021-07-26 NOTE — Telephone Encounter (Signed)
First post procedure follow up call, no answer 

## 2021-07-26 NOTE — Telephone Encounter (Signed)
Attempted f/u call. No answer, left VM. 

## 2021-07-29 ENCOUNTER — Encounter: Payer: Self-pay | Admitting: Internal Medicine

## 2021-08-13 ENCOUNTER — Encounter: Payer: Self-pay | Admitting: Family Medicine

## 2021-08-14 ENCOUNTER — Encounter: Payer: Self-pay | Admitting: Family Medicine

## 2021-08-14 ENCOUNTER — Ambulatory Visit (INDEPENDENT_AMBULATORY_CARE_PROVIDER_SITE_OTHER): Payer: Managed Care, Other (non HMO) | Admitting: Family Medicine

## 2021-08-14 ENCOUNTER — Other Ambulatory Visit: Payer: Self-pay

## 2021-08-14 VITALS — BP 112/73 | HR 86 | Temp 98.3°F | Ht 72.5 in | Wt 156.4 lb

## 2021-08-14 DIAGNOSIS — K51911 Ulcerative colitis, unspecified with rectal bleeding: Secondary | ICD-10-CM | POA: Diagnosis not present

## 2021-08-14 DIAGNOSIS — Z0000 Encounter for general adult medical examination without abnormal findings: Secondary | ICD-10-CM

## 2021-08-14 NOTE — Progress Notes (Signed)
Office Note ?08/14/2021 ? ?CC:  ?Chief Complaint  ?Patient presents with  ? Establish Care  ? ? ?HPI:  ?Joe Esparza is a 26 y.o.male who is here to establish care and get cpe. ?Patient's most recent primary MD: none ?Old records were reviewed prior to or during today's visit. ? ?Diagnosed with ulcerative colitis a few weeks ago.  His father has ulcerative colitis as well. ?Townes has ongoing bloody stools.  Usually worst 1 is in the morning and these tail off over the course of his day.  Average of 5/day, very mild lower abdominal cramping sometimes.  No fevers.  Appetite is down a little bit compared to his normal but nothing too bad he says. ?No fever or other systemic symptoms. ?Unfortunately he says he is unable to swallow pills at all, particularly the large pills prescribed by the gastroenterologist recently---Lialda. ? 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 ? ?Past Medical History:  ?Diagnosis Date  ? Blood in stool   ? 07/24/2021 colonoscopy showed ulcerative colitis--lialda started by GI  ? Frequent headaches   ? tension and migraine->neuro eval 2021  ? Low back pain   ? Palpitations   ? rhythm monitoring in remote past (cardiologist) showed premature atrial beats but o/w arrhythmia, cardiology--reassured.  echo normal  ? Ulcerative colitis ([place])   ? dx'd by colonoscopy/bx 07/2021  ? ? ?Past Surgical History:  ?Procedure Laterality Date  ? COLONOSCOPY  07/24/2021  ? Ulcerative colitis involving rectosigmoid region and cecum and proximal ascending colon.  ? ? ?Family History  ?Problem Relation Age of Onset  ? Cancer Father   ? Heart attack Father   ? High blood pressure Father   ? Drug abuse Brother   ? Asthma Brother   ? Arthritis Maternal Grandmother   ? High blood pressure Maternal Grandfather   ? Kidney disease Maternal Grandfather   ? Heart disease Paternal Grandmother   ? Stroke Paternal Grandfather   ? High blood pressure Paternal Grandfather   ? Heart attack  Paternal Grandfather   ? Diabetes Paternal Grandfather   ? Cancer Paternal Grandfather   ? Colon cancer Neg Hx   ? Rectal cancer Neg Hx   ? Stomach cancer Neg Hx   ? ? ?Social History  ? ?Socioeconomic History  ? Marital status: Single  ?  Spouse name: Not on file  ? Number of children: Not on file  ? Years of education: Not on file  ? Highest education level: Not on file  ?Occupational History  ? Not on file  ?Tobacco Use  ? Smoking status: Never  ?  Passive exposure: Never  ? Smokeless tobacco: Never  ?Vaping Use  ? Vaping Use: Never used  ?Substance and Sexual Activity  ? Alcohol use: Not Currently  ? Drug use: Never  ? Sexual activity: Not on file  ?Other Topics Concern  ? Not on file  ?Social History Narrative  ? Single.  ? Educ: working on [employer] as of 2023--.[employer] in [place], [place].  ? College golf.  ? No T/A/Ds  ? ?Social Determinants of Health  ? ?Financial Resource Strain: Not on file  ?Food Insecurity: Not on file  ?Transportation Needs: Not on file  ?Physical Activity: Not on file  ?Stress: Not on file  ?Social Connections: Not on file  ?Intimate Partner Violence: Not on file  ? ? ?Outpatient Encounter Medications as of 08/14/2021  ?Medication Sig  ? mesalamine (LIALDA)  1.2 g EC tablet Take 4 tablets (4.8 g total) by mouth daily with breakfast. (Patient not taking: Reported on 08/14/2021)  ? mesalamine (ROWASA) 4 g enema Place 60 mLs (4 g total) rectally at bedtime. (Patient not taking: Reported on 08/14/2021)  ? ?No facility-administered encounter medications on file as of 08/14/2021.  ? ? ?No Known Allergies ? ? ?Review of Systems  ?Constitutional:  Negative for appetite change, chills, fatigue and fever.  ?HENT:  Negative for congestion, dental problem, ear pain and sore throat.   ?Eyes:  Negative for discharge, redness and visual disturbance.  ?Respiratory:  Negative for cough, chest tightness, shortness of breath and wheezing.   ?Cardiovascular:  Negative for chest pain, palpitations and leg  swelling.  ?Gastrointestinal:  Positive for blood in stool, diarrhea and rectal pain. Negative for abdominal pain, nausea and vomiting.  ?Genitourinary:  Negative for difficulty urinating, dysuria, flank pain, frequency, hematuria and urgency.  ?Musculoskeletal:  Negative for arthralgias, back pain, joint swelling, myalgias and neck stiffness.  ?Skin:  Negative for pallor and rash.  ?Neurological:  Negative for dizziness, speech difficulty, weakness and headaches.  ?Hematological:  Negative for adenopathy. Does not bruise/bleed easily.  ?Psychiatric/Behavioral:  Negative for confusion and sleep disturbance. The patient is not nervous/anxious.   ? ?PE; ?Blood pressure 112/73, pulse 86, temperature 98.3 ?F (36.8 ?C), temperature source Oral, height 6' 0.5" (1.842 m), weight 156 lb 6.4 oz (70.9 kg), SpO2 98 %.Body mass index is 20.92 kg/m?. ? ? ?Physical Exam ? ?Gen: Alert, well appearing.  Patient is oriented to person, place, time, and situation. ?AFFECT: pleasant, lucid thought and speech. ?ENT: Ears: EACs clear, normal epithelium.  TMs with good light reflex and landmarks bilaterally.  Eyes: no injection, icteris, swelling, or exudate.  EOMI, PERRLA. ?Nose: no drainage or turbinate edema/swelling.  No injection or focal lesion.  Mouth: lips without lesion/swelling.  Oral mucosa pink and moist.  Dentition intact and without obvious caries or gingival swelling.  Oropharynx without erythema, exudate, or swelling.  ?Neck: supple/nontender.  No LAD, mass, or TM.  Carotid pulses 2+ bilaterally, without bruits. ?CV: RRR, no m/r/g.   ?LUNGS: CTA bilat, nonlabored resps, good aeration in all lung fields. ?ABD: soft, NT, ND, BS normal.  No hepatospenomegaly or mass.  No bruits. ?EXT: no clubbing, cyanosis, or edema.  ?Musculoskeletal: no joint swelling, erythema, warmth, or tenderness.  ROM of all joints intact. ?Skin - no sores or suspicious lesions or rashes or color changes ? ?Pertinent labs:  ?Last CBC ?Lab Results   ?Component Value Date  ? WBC 10.7 (H) 07/10/2021  ? HGB 14.7 07/10/2021  ? HCT 42.7 07/10/2021  ? MCV 85.7 07/10/2021  ? RDW 12.6 07/10/2021  ? PLT 272.0 07/10/2021  ? ?Lab Results  ?Component Value Date  ? ESRSEDRATE 5 07/10/2021  ? ?Last metabolic panel ?Lab Results  ?Component Value Date  ? GLUCOSE 89 07/10/2021  ? NA 140 07/10/2021  ? K 4.0 07/10/2021  ? CL 103 07/10/2021  ? CO2 28 07/10/2021  ? BUN 15 07/10/2021  ? CREATININE 0.99 07/10/2021  ? CALCIUM 9.8 07/10/2021  ? PROT 8.2 07/10/2021  ? ALBUMIN 4.8 07/10/2021  ? BILITOT 0.4 07/10/2021  ? ALKPHOS 95 07/10/2021  ? AST 17 07/10/2021  ? ALT 12 07/10/2021  ? ?Last thyroid functions ?Lab Results  ?Component Value Date  ? TSH 2.31 07/10/2021  ? ?ASSESSMENT AND PLAN:  ? ?New patient, establishing care. ? ?1) Health maintenance exam: ?Reviewed age and gender appropriate health  maintenance issues (prudent diet, regular exercise, health risks of tobacco and excessive alcohol, use of seatbelts, fire alarms in home, use of sunscreen).  Also reviewed age and gender appropriate health screening as well as vaccine recommendations. ?Vaccines: He declined Tdap booster today. ?Labs: Recent CBC, c-Met, and TSH normal. ? ?2) ulcerative colitis, very recent diagnosis.  Ongoing bloody stools but otherwise feeling well.  Unable to swallow Slyvester Latona as it was prescribed.  He has GI follow-up 08/20/2021 and will discuss this problem at that time ?He is trying some dietary changes such as lower carbs.  He asks if he can get vitamin B12, vitamin B6, and vitamin D level checked in the future so I ordered these. ? ?An After Visit Summary was printed and given to the patient. ? ?Return for as needed. ? ?Signed:  Crissie Sickles, MD           08/14/2021 ? ?

## 2021-08-20 ENCOUNTER — Ambulatory Visit: Payer: Managed Care, Other (non HMO) | Admitting: Internal Medicine

## 2022-12-12 IMAGING — DX DG HIP (WITH OR WITHOUT PELVIS) 2-3V*L*
3 series · 3 of 3 positions shown · non-contrast
Comparison: None.

CLINICAL DATA: Left hip pain

EXAM:
DG HIP (WITH OR WITHOUT PELVIS) 2-3V LEFT

[pelvis ap]
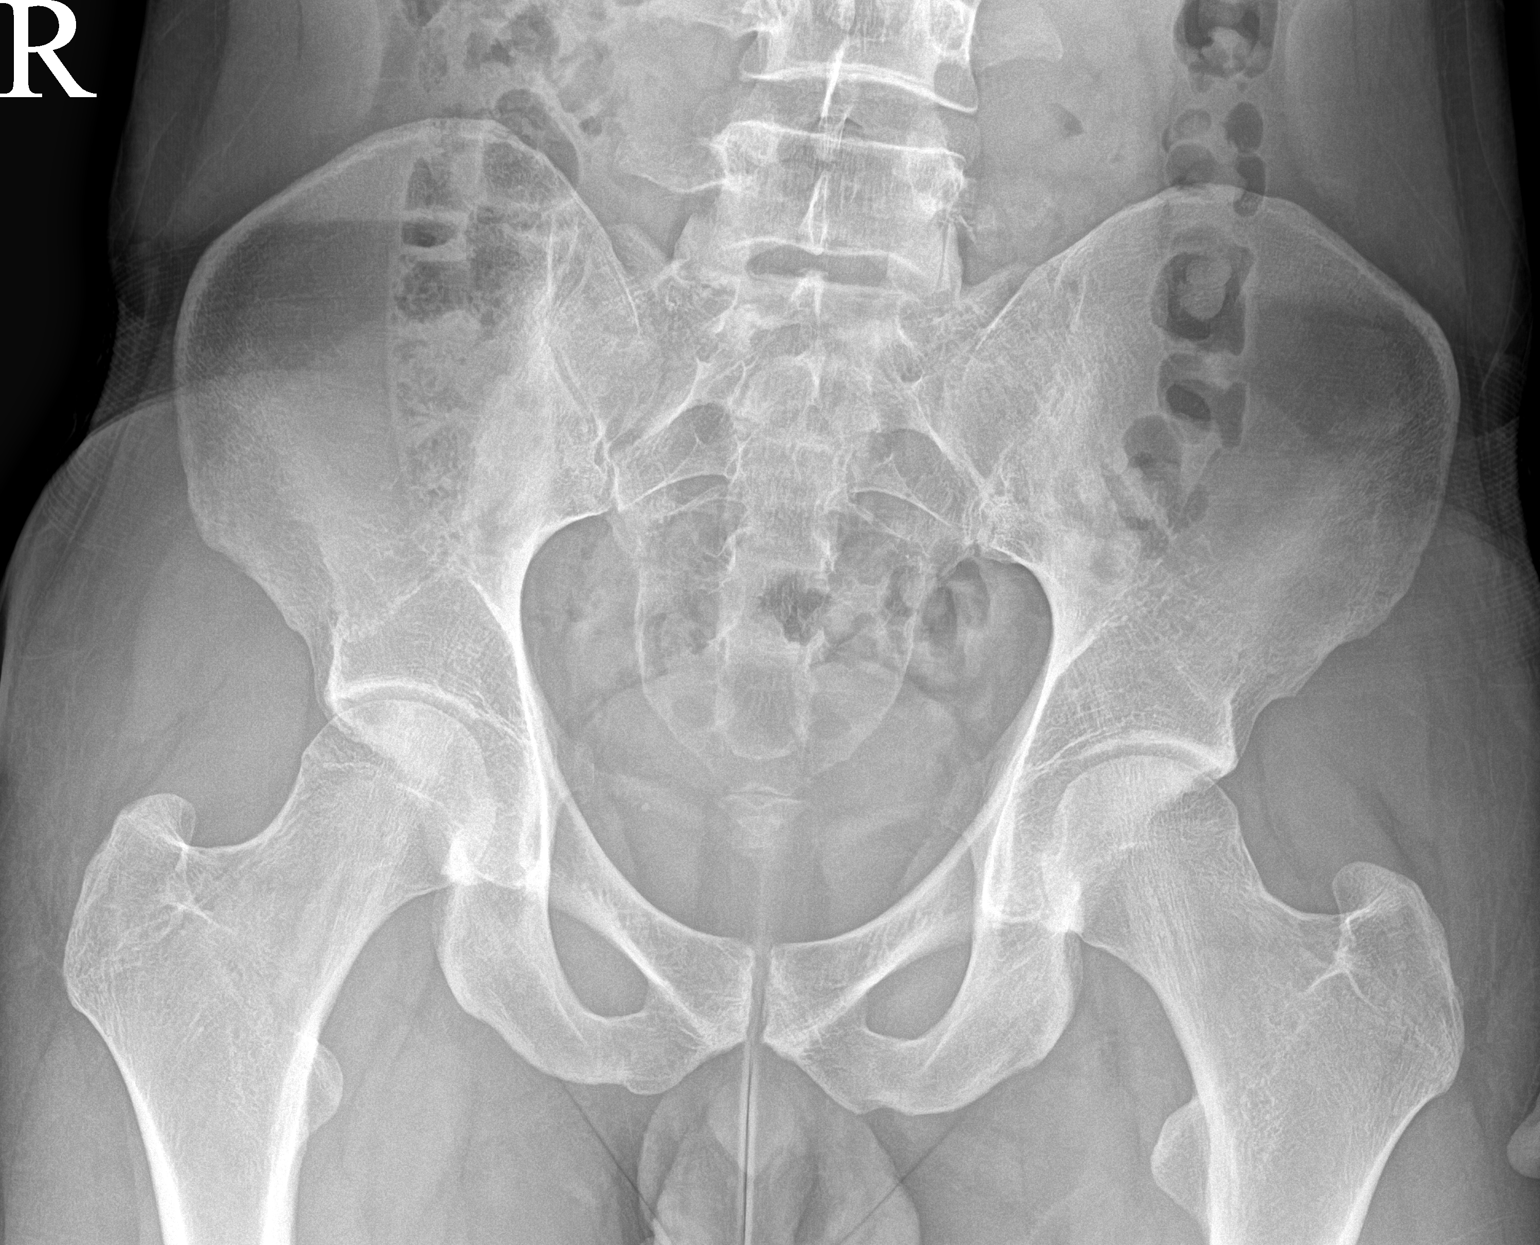

[hip ap]
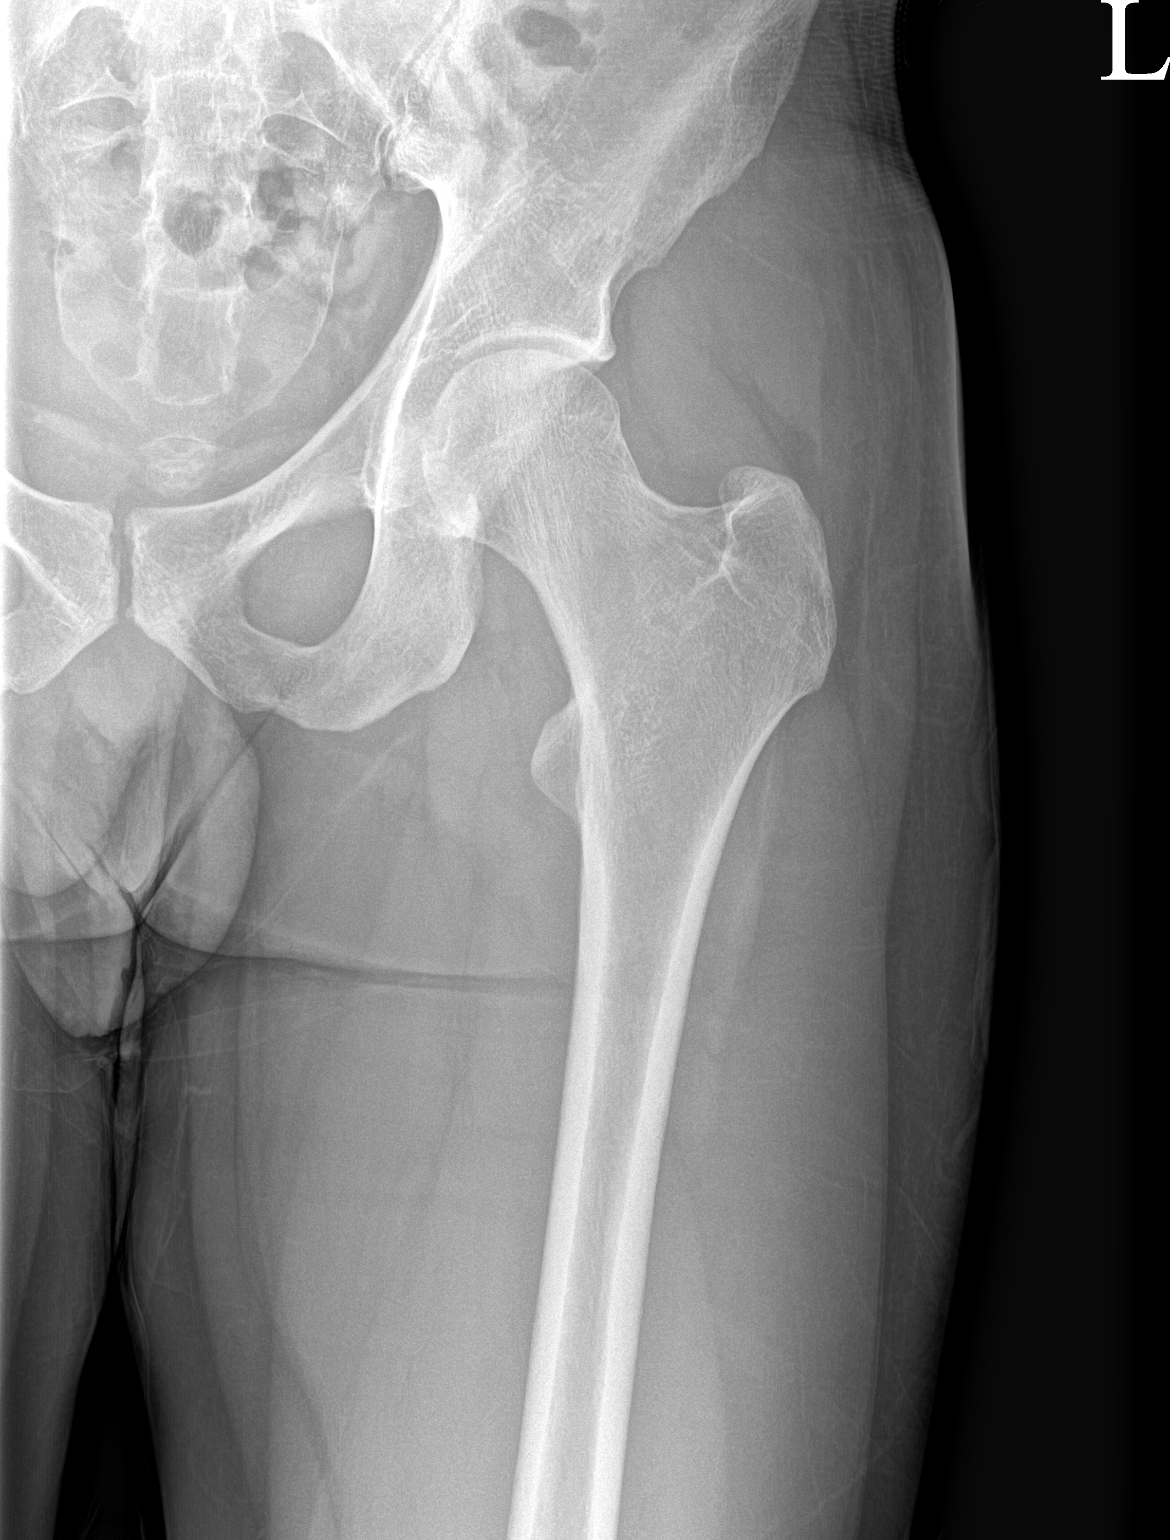

[hip frog leg]
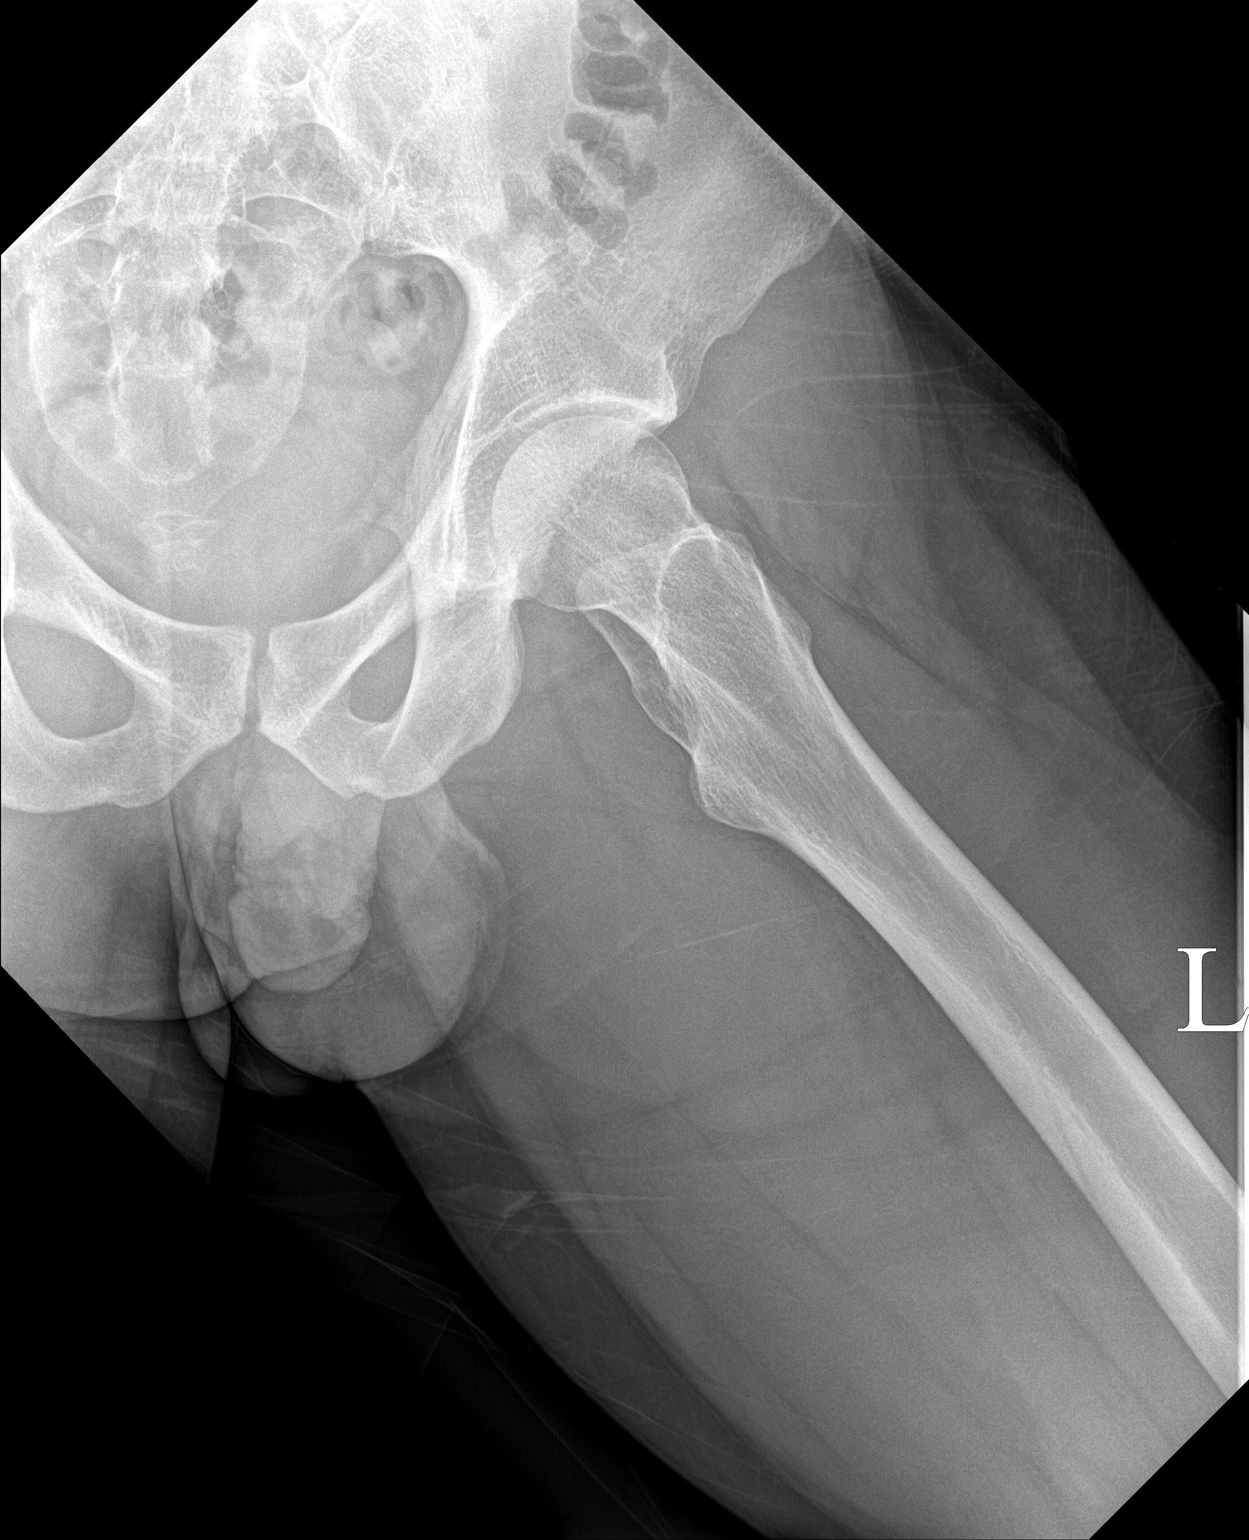

[3 of 3 positions shown; findings below may reference images not displayed]

FINDINGS: There is no evidence of hip fracture or dislocation. There is no
evidence of arthropathy or other focal bone abnormality.
IMPRESSION: Negative.
